# Patient Record
Sex: Female | Born: 2005 | Race: White | Hispanic: No | Marital: Single | Smoking: Never smoker
Health system: Southern US, Community
[De-identification: ages and names within clinical notes are randomized; demographics above are authoritative.]

## PROBLEM LIST (undated history)

## (undated) DIAGNOSIS — J45909 Unspecified asthma, uncomplicated: Secondary | ICD-10-CM

## (undated) HISTORY — DX: Unspecified asthma, uncomplicated: J45.909

---
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
## 2024-08-14 ENCOUNTER — Ambulatory Visit: Payer: Self-pay | Admitting: Family Medicine

## 2024-08-14 NOTE — Telephone Encounter (Signed)
 FYI Only or Action Required?: FYI only for provider: appointment scheduled on 12/23.  Patient was last seen in primary care on 11/10/2022 by Lavell Bari LABOR, FNP.  Called Nurse Triage reporting Cough.  Symptoms began several days ago.  Interventions attempted: OTC medications: Mucinex and Prescription medications: Albuterol  nebulizer.  Symptoms are: gradually worsening.  Triage Disposition: See Physician Within 24 Hours  Patient/caregiver understands and will follow disposition?: Yes    Spoke with pts mom [place] (on [place]). Onset of dry cough with sore throat and body aches on Wednesday last week. Has hx of asthma. No wheezing. Some SOB with coughing, none at rest. Using albuterol  nebulizer once, needing to have albuterol  and flovent  inhalers refilled as she only has a few doses left. Scheduled appt with doc of the day at home office tomorrow d/t no PCP availability within timeframe. Advised UC or ED for worsening symptoms.      Copied from CRM #[id number]. Topic: Clinical - Red Word Triage >> Aug 14, 2024  1:10 PM Ameerah G wrote: Red Word that prompted transfer to Nurse Triage: sore throat, coughing hurts,and pt has asthma. Low grade fever and symptoms not getting better. Reason for Disposition  SEVERE coughing spells (e.g., whooping sound after coughing, vomiting after coughing)  Answer Assessment - Initial Assessment Questions 1. ONSET: When did the cough begin?      Wednesday of last week  2. SEVERITY: How bad is the cough today?      Moderate  3. SPUTUM: Describe the color of your sputum (e.g., none, dry cough; clear, white, yellow, green)     Dry cough  4. HEMOPTYSIS: Are you coughing up any blood? If Yes, ask: How much? (e.g., flecks, streaks, tablespoons, etc.)     Denies  5. DIFFICULTY BREATHING: Are you having difficulty breathing? If Yes, ask: How bad is it? (e.g., mild, moderate, severe)      Some SOB with coughing  6. FEVER: Do you have a fever? If  Yes, ask: What is your temperature, how was it measured, and when did it start?     99 F  7. CARDIAC HISTORY: Do you have any history of heart disease? (e.g., heart attack, congestive heart failure)      Denies  8. LUNG HISTORY: Do you have any history of lung disease?  (e.g., pulmonary embolus, asthma, emphysema)     Asthma  9. PE RISK FACTORS: Do you have a history of blood clots? (or: recent major surgery, recent prolonged travel, bedridden)     Denies  10. OTHER SYMPTOMS: Do you have any other symptoms? (e.g., runny nose, wheezing, chest pain)       Runny nose, body aches  11. PREGNANCY: Is there any chance you are pregnant? When was your last menstrual period?       Denies  12. TRAVEL: Have you traveled out of the country in the last month? (e.g., travel history, exposures)       Denies  Protocols used: Cough - Acute Non-Productive-A-AH

## 2024-08-15 ENCOUNTER — Ambulatory Visit: Admitting: Nurse Practitioner

## 2024-08-15 ENCOUNTER — Encounter: Payer: Self-pay | Admitting: Nurse Practitioner

## 2024-08-15 VITALS — BP 127/77 | HR 101 | Temp 97.3°F | Ht 67.87 in | Wt 155.4 lb

## 2024-08-15 DIAGNOSIS — J069 Acute upper respiratory infection, unspecified: Secondary | ICD-10-CM

## 2024-08-15 MED ORDER — BENZONATATE 100 MG PO CAPS: 100.0000 mg | ORAL_CAPSULE | Freq: Three times a day (TID) | ORAL | 0 refills | Status: AC | PRN

## 2024-08-15 MED ORDER — FLUTICASONE PROPIONATE 50 MCG/ACT NA SUSP: 2.0000 | Freq: Every day | NASAL | 6 refills | Status: AC

## 2024-08-15 MED ORDER — AZITHROMYCIN 250 MG PO TABS: ORAL_TABLET | ORAL | 0 refills | Status: AC

## 2024-08-15 NOTE — Progress Notes (Signed)
 "    Subjective:  Patient ID: Jasmin Crosby, female    DOB: February 14, 2006, 18 y.o.   MRN: [id number]  Patient Care Team: Zollie Lowers, MD as PCP - General (Family Medicine)   Chief Complaint:  Sore Throat, Cough, and Nasal Congestion (X 1 week - using mucinex otc & day quil )   HPI: Jasmin Crosby is a 18 y.o. female presenting on 08/15/2024 for Sore Throat, Cough, and Nasal Congestion (X 1 week - using mucinex otc & day quil )   Discussed the use of AI scribe software for clinical note transcription with the patient, who gave verbal consent to proceed.  History of Present Illness Jasmin Crosby is a 18 year old female with asthma who presents with a sore throat and cough.  She has experienced a sore throat for approximately one week, which worsens at night and is accompanied by a productive cough with green mucus that disrupts her sleep.  She has been exposed to her stepmother, who was sick, but denies any known exposure to COVID-19 or flu. She has been using Mucinex for the cough, which provides some relief.  No fever is reported, but she experiences muscle aches. She experiences shortness of breath when trying to sleep. Nasal congestion is present, but she denies headaches.  Her last menstrual cycle began on the fourth of this month.    LMP 08/06/2024  Relevant past medical, surgical, family, and social history reviewed and updated as indicated.  Allergies and medications reviewed and updated. Data reviewed: Chart in Epic.   Past Medical History:  Diagnosis Date   Asthma     History reviewed. No pertinent surgical history.  Social History   Socioeconomic History   Marital status: Single    Spouse name: Not on file   Number of children: Not on file   Years of education: Not on file   Highest education level: Not on file  Occupational History   Not on file  Tobacco Use   Smoking status: Never    Passive exposure: Never   Smokeless tobacco: Never  Vaping  Use   Vaping status: Never Used  Substance and Sexual Activity   Alcohol use: No    Alcohol/week: 0.0 standard drinks of alcohol   Drug use: No   Sexual activity: Not on file  Other Topics Concern   Not on file  Social History Narrative   Not on file   Social Drivers of Health   Tobacco Use: Low Risk (08/15/2024)   Patient History    Smoking Tobacco Use: Never    Smokeless Tobacco Use: Never    Passive Exposure: Never  Financial Resource Strain: Not on file  Food Insecurity: Not on file  Transportation Needs: Not on file  Physical Activity: Not on file  Stress: Not on file  Social Connections: Not on file  Intimate Partner Violence: Not on file  Depression (PHQ2-9): Low Risk (10/30/2021)   Depression (PHQ2-9)    PHQ-2 Score: 0  Alcohol Screen: Not on file  Housing: Not on file  Utilities: Not on file  Health Literacy: Not on file    Outpatient Encounter Medications as of 08/15/2024  Medication Sig   albuterol  (VENTOLIN  HFA) 108 (90 Base) MCG/ACT inhaler Inhale 2 puffs into the lungs every 4 (four) hours as needed for wheezing or shortness of breath (coughing fits).   azithromycin  (ZITHROMAX ) 250 MG tablet Take 2 tablets on day 1, then 1 tablet daily on days 2 through 5   benzonatate  (  TESSALON ) 100 MG capsule Take 1 capsule (100 mg total) by mouth 3 (three) times daily as needed for cough.   famotidine  (PEPCID ) 40 MG tablet Take 1 tablet (40 mg total) by mouth daily.   ferrous sulfate 325 (65 FE) MG EC tablet Take 325 mg by mouth 3 (three) times daily with meals.   fluticasone  (FLONASE ) 50 MCG/ACT nasal spray Place 2 sprays into both nostrils daily.   fluticasone  (FLOVENT  HFA) 110 MCG/ACT inhaler Inhale 2 puffs into the lungs daily. with spacer and rinse mouth afterwards.   ibuprofen  (ADVIL ) 600 MG tablet Take 1 tablet every 12 hours as needed.   medroxyPROGESTERone  Acetate 150 MG/ML SUSY INJECT 1 ML INTRAMUSCULARLY ONCE EVERY 3 MONTHS   Multiple Vitamin (MULTIVITAMIN)  tablet Take 1 tablet by mouth daily.   [DISCONTINUED] cephALEXin  (KEFLEX ) 500 MG capsule Take 1 capsule (500 mg total) by mouth 3 (three) times daily.   Facility-Administered Encounter Medications as of 08/15/2024  Medication   medroxyPROGESTERone  (DEPO-PROVERA ) injection 150 mg    Allergies[1]  Pertinent ROS per HPI, otherwise unremarkable      Objective:  BP 127/77   Pulse (!) 101   Temp (!) 97.3 F (36.3 C)   Ht 5' 7.87 (1.724 m)   Wt 155 lb 6.4 oz (70.5 kg)   SpO2 97%   BMI 23.72 kg/m    Wt Readings from Last 3 Encounters:  08/15/24 155 lb 6.4 oz (70.5 kg) (85%, Z= 1.05)*  08/05/22 158 lb 9.6 oz (71.9 kg) (90%, Z= 1.29)*  12/25/21 131 lb (59.4 kg) (68%, Z= 0.47)*   * Growth percentiles are based on CDC (Girls, 2-20 Years) data.    Physical Exam Vitals and nursing note reviewed.  Constitutional:      Appearance: She is obese.  HENT:     Head: Normocephalic and atraumatic.     Right Ear: Tympanic membrane, ear canal and external ear normal. There is no impacted cerumen.     Left Ear: Tympanic membrane, ear canal and external ear normal. There is no impacted cerumen.     Nose: Congestion present.  Eyes:     Extraocular Movements: Extraocular movements intact.     Conjunctiva/sclera: Conjunctivae normal.     Pupils: Pupils are equal, round, and reactive to light.  Cardiovascular:     Heart sounds: Normal heart sounds.  Pulmonary:     Effort: Pulmonary effort is normal.     Breath sounds: Normal breath sounds.  Musculoskeletal:        General: Normal range of motion.     Right lower leg: No edema.     Left lower leg: No edema.  Skin:    General: Skin is warm and dry.     Findings: No rash.  Neurological:     Mental Status: She is alert and oriented to person, place, and time.  Psychiatric:        Mood and Affect: Mood normal.        Behavior: Behavior normal.        Judgment: Judgment normal.    Physical Exam CHEST: Lungs clear to auscultation, no  wheezing.     Results for orders placed or performed in visit on 08/05/22  Rapid Strep Screen ([facility] ONLY)   Collection Time: 08/05/22 11:13 AM   Specimen: Other   Other  Result Value Ref Range   Strep Gp A Ag, IA W/Reflex Negative Negative  Culture, Group A Strep   Collection Time: 08/05/22 11:13 AM  Other  Result Value Ref Range   Strep A Culture CANCELED        Pertinent labs & imaging results that were available during my care of the patient were reviewed by me and considered in my medical decision making.  Assessment & Plan:  Lacora was seen today for sore throat, cough and nasal congestion.  Diagnoses and all orders for this visit:  URI with cough and congestion -     benzonatate  (TESSALON ) 100 MG capsule; Take 1 capsule (100 mg total) by mouth 3 (three) times daily as needed for cough. -     fluticasone  (FLONASE ) 50 MCG/ACT nasal spray; Place 2 sprays into both nostrils daily.  Other orders -     azithromycin  (ZITHROMAX ) 250 MG tablet; Take 2 tablets on day 1, then 1 tablet daily on days 2 through 5     Assessment and Plan Marit is a 18 year old female seen today for URI, no acute distress Assessment & Plan Acute upper respiratory infection Productive cough with green mucus. - Prescribed Tessalon  Perles, one tablet three times a day as needed for cough. - Prescribed Flonase  nasal spray once a day for nasal congestion. - Prescribed Z-Pak (azithromycin ), two tablets today, then one tablet daily for four more days.  Asthma Increased coughing and shortness of breath at night. - Ensure adequate hydration.      Continue all other maintenance medications.  Follow up plan: No follow-ups on file.   Continue healthy lifestyle choices, including diet (rich in fruits, vegetables, and lean proteins, and low in salt and simple carbohydrates) and exercise (at least 30 minutes of moderate physical activity daily).  Educational handout given for   Upper  Respiratory Infection, Adult An upper respiratory infection (URI) affects the nose, throat, and upper airways that lead to the lungs. The most common type of URI is often called the common cold. URIs usually get better on their own, without medical treatment. What are the causes? A URI is caused by a germ (virus). You may catch these germs by: Breathing in droplets from an infected person's cough or sneeze. Touching something that has the germ on it (is contaminated) and then touching your mouth, nose, or eyes. What increases the risk? You are more likely to get a URI if: You are very young or very old. You have close contact with others, such as at work, school, or a health care facility. You smoke. You have long-term (chronic) heart or lung disease. You have a weakened disease-fighting system (immune system). You have nasal allergies or asthma. You have a lot of stress. You have poor nutrition. What are the signs or symptoms? Runny or stuffy (congested) nose. Cough. Sneezing. Sore throat. Headache. Feeling tired (fatigue). Fever. Not wanting to eat as much as usual. Pain in your forehead, behind your eyes, and over your cheekbones (sinus pain). Muscle aches. Redness or irritation of the eyes. Pressure in the ears or face. How is this treated? URIs usually get better on their own within 7-10 days. Medicines cannot cure URIs, but your doctor may recommend certain medicines to help relieve symptoms, such as: Over-the-counter cold medicines. Medicines to reduce coughing (cough suppressants). Coughing is a type of defense against infection that helps to clear the nose, throat, windpipe, and lungs (respiratory system). Take these medicines only as told by your doctor. Medicines to lower your fever. Follow these instructions at home: Activity Rest as needed. If you have a fever, stay home from work or school until your  fever is gone, or until your doctor says you may return to work or  school. You should stay home until you cannot spread the infection anymore (you are not contagious). Your doctor may have you wear a face mask so you have less risk of spreading the infection. Relieving symptoms Rinse your mouth often with salt water. To make salt water, dissolve -1 tsp (3-6 g) of salt in 1 cup (237 mL) of warm water. Use a cool-mist humidifier to add moisture to the air. This can help you breathe more easily. Eating and drinking  Drink enough fluid to keep your pee (urine) pale yellow. Eat soups and other clear broths. General instructions  Take over-the-counter and prescription medicines only as told by your doctor. Do not smoke or use any products that contain nicotine or tobacco. If you need help quitting, ask your doctor. Avoid being where people are smoking (avoid secondhand smoke). Stay up to date on all your shots (immunizations), and get the flu shot every year. Keep all follow-up visits. How to prevent the spread of infection to others  Wash your hands with soap and water for at least 20 seconds. If you cannot use soap and water, use hand sanitizer. Avoid touching your mouth, face, eyes, or nose. Cough or sneeze into a tissue or your sleeve or elbow. Do not cough or sneeze into your hand or into the air. Contact a doctor if: You are getting worse, not better. You have any of these: A fever or chills. Brown or red mucus in your nose. Yellow or brown fluid (discharge)coming from your nose. Pain in your face, especially when you bend forward. Swollen neck glands. Pain when you swallow. White areas in the back of your throat. Get help right away if: You have shortness of breath that gets worse. You have very bad or constant: Headache. Ear pain. Pain in your forehead, behind your eyes, and over your cheekbones (sinus pain). Chest pain. You have long-lasting (chronic) lung disease along with any of these: Making high-pitched whistling sounds when you  breathe, most often when you breathe out (wheezing). Long-lasting cough (more than 14 days). Coughing up blood. A change in your usual mucus. You have a stiff neck. You have changes in your: Vision. Hearing. Thinking. Mood. These symptoms may be an emergency. Get help right away. Call 911. Do not wait to see if the symptoms will go away. Do not drive yourself to the hospital. Summary An upper respiratory infection (URI) is caused by a germ (virus). The most common type of URI is often called the common cold. URIs usually get better within 7-10 days. Take over-the-counter and prescription medicines only as told by your doctor. This information is not intended to replace advice given to you by your health care provider. Make sure you discuss any questions you have with your health care provider. Document Revised: 03/12/2021 Document Reviewed: 03/12/2021 Elsevier Patient Education  2024 [employer]    The above assessment and management plan was discussed with the patient. The patient verbalized understanding of and has agreed to the management plan. Patient is aware to call the clinic if they develop any new symptoms or if symptoms persist or worsen. Patient is aware when to return to the clinic for a follow-up visit. Patient educated on when it is appropriate to go to the emergency department.   Verlean Allport [place] Thompson, DNP [facility] [address] [place], [place] [address] [phone]       [  1]  Allergies Allergen Reactions   Penicillins Hives   "
